# Patient Record
Sex: Female | Born: 1970 | Race: Black or African American | Hispanic: No | Marital: Married | Smoking: Former smoker
Health system: Southern US, Community
[De-identification: ages and names within clinical notes are randomized; demographics above are authoritative.]

## PROBLEM LIST (undated history)

## (undated) DIAGNOSIS — F419 Anxiety disorder, unspecified: Secondary | ICD-10-CM

## (undated) DIAGNOSIS — F329 Major depressive disorder, single episode, unspecified: Secondary | ICD-10-CM

## (undated) DIAGNOSIS — D649 Anemia, unspecified: Secondary | ICD-10-CM

## (undated) DIAGNOSIS — R011 Cardiac murmur, unspecified: Secondary | ICD-10-CM

## (undated) DIAGNOSIS — I10 Essential (primary) hypertension: Secondary | ICD-10-CM

## (undated) DIAGNOSIS — F32A Depression, unspecified: Secondary | ICD-10-CM

## (undated) HISTORY — DX: Essential (primary) hypertension: I10

## (undated) HISTORY — DX: Depression, unspecified: F32.A

## (undated) HISTORY — DX: Anxiety disorder, unspecified: F41.9

## (undated) HISTORY — DX: Anemia, unspecified: D64.9

## (undated) HISTORY — DX: Cardiac murmur, unspecified: R01.1

---
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
## 1999-09-26 ENCOUNTER — Inpatient Hospital Stay (HOSPITAL_COMMUNITY): Admission: AD | Admit: 1999-09-26 | Discharge: 1999-09-29 | Payer: Self-pay | Admitting: Gynecology

## 1999-12-09 ENCOUNTER — Other Ambulatory Visit: Admission: RE | Admit: 1999-12-09 | Discharge: 1999-12-09 | Payer: Self-pay | Admitting: Gynecology

## 2000-12-28 ENCOUNTER — Other Ambulatory Visit: Admission: RE | Admit: 2000-12-28 | Discharge: 2000-12-28 | Payer: Self-pay | Admitting: Gynecology

## 2003-02-17 ENCOUNTER — Other Ambulatory Visit: Admission: RE | Admit: 2003-02-17 | Discharge: 2003-02-17 | Payer: Self-pay | Admitting: Internal Medicine

## 2004-10-25 ENCOUNTER — Emergency Department (HOSPITAL_COMMUNITY): Admission: EM | Admit: 2004-10-25 | Discharge: 2004-10-25 | Payer: Self-pay | Admitting: Family Medicine

## 2005-04-12 ENCOUNTER — Other Ambulatory Visit: Admission: RE | Admit: 2005-04-12 | Discharge: 2005-04-12 | Payer: Self-pay | Admitting: Obstetrics and Gynecology

## 2005-10-01 ENCOUNTER — Inpatient Hospital Stay (HOSPITAL_COMMUNITY): Admission: AD | Admit: 2005-10-01 | Discharge: 2005-10-03 | Payer: Self-pay | Admitting: Obstetrics and Gynecology

## 2005-10-01 ENCOUNTER — Encounter (INDEPENDENT_AMBULATORY_CARE_PROVIDER_SITE_OTHER): Payer: Self-pay | Admitting: Specialist

## 2005-11-08 ENCOUNTER — Other Ambulatory Visit: Admission: RE | Admit: 2005-11-08 | Discharge: 2005-11-08 | Payer: Self-pay | Admitting: Obstetrics and Gynecology

## 2006-07-25 ENCOUNTER — Emergency Department (HOSPITAL_COMMUNITY): Admission: EM | Admit: 2006-07-25 | Discharge: 2006-07-25 | Payer: Self-pay | Admitting: Emergency Medicine

## 2008-06-27 ENCOUNTER — Inpatient Hospital Stay (HOSPITAL_COMMUNITY): Admission: AD | Admit: 2008-06-27 | Discharge: 2008-06-28 | Payer: Self-pay | Admitting: Obstetrics and Gynecology

## 2008-07-01 ENCOUNTER — Inpatient Hospital Stay (HOSPITAL_COMMUNITY): Admission: RE | Admit: 2008-07-01 | Discharge: 2008-07-03 | Payer: Self-pay | Admitting: Obstetrics and Gynecology

## 2010-12-10 NOTE — Discharge Summary (Signed)
[facility]  Patient:    Alexis Robertson, Alexis Robertson                    MRN: [id number] Adm. Date:  [id number] Disc. Date: [id number] Attending:  Tonye Royalty Dictator:   Antony Contras, RNC, [facility]                           Discharge Summary  DISCHARGE DIAGNOSES:          1. Intrauterine pregnancy at term.                               2. Normal spontaneous vaginal delivery of a viable                                  female infant.  HISTORY OF PRESENT ILLNESS:   The patient is a 40 year old, gravida 2, para 1-0-0-1, with an EDC of September 30, 1999.  Previous pregnancy was complicated by preterm labor, but she did deliver at term.  She also had positive GBS culture ith her previous pregnancy.  PRENATAL LABORATORY DATA:     Blood type O positive, antibody screen negative, sickle cell trait negative, rubella titer positive.  HBSAG, HIV, negative. MSAFP within normal limits.  HOSPITAL COURSE:              The patient was admitted to [facility] [place] at 39-1/7 weeks with contractions two to five minutes apart. Reasuring fetal heart rate tracing.  Cervix at this point was 3 to 4 cm, completely effaced, with a +1 station.  Clear amniotic fluid plus positive nitrazine.  She was admitted to labor and delivery and started on penicillin-G per protocol.  She progressed to complete dilatation and delivered an Apgars 8 and 9 female infant weighing 7 pounds. Cord blood pH was 7.29.  Placenta intact with three vessel cord. Midline episiotomy.  Postpartum course was complicated by some mild dizziness which did  respond to increased fluids.  Her postpartum CBC revealed a mild anemia. Hematocrit 29.6, hemoglobin 9.7, platelets 164, WBC 4.4.  She was able to be discharged on her second postpartum day.  She remained afebrile throughout her course and was able to go home with her baby in satisfactory condition.  DISPOSITION:                   Follow up in the office in four to six weeks. Continue with prenatal vitamins and iron.  Motrin 600 mg p.o. p.r.n. q.6h. for pain. DD:  10/15/99 TD:  10/15/99 Job: [id number]

## 2011-04-29 LAB — CBC
HCT: 29.4 % — ABNORMAL LOW (ref 36.0–46.0)
HCT: 32.9 % — ABNORMAL LOW (ref 36.0–46.0)
Hemoglobin: 10.2 g/dL — ABNORMAL LOW (ref 12.0–15.0)
MCHC: 34.5 g/dL (ref 30.0–36.0)
Platelets: 140 10*3/uL — ABNORMAL LOW (ref 150–400)
Platelets: 192 10*3/uL (ref 150–400)
RBC: 3.82 MIL/uL — ABNORMAL LOW (ref 3.87–5.11)
RDW: 14.3 % (ref 11.5–15.5)

## 2011-04-29 LAB — RPR: RPR Ser Ql: NONREACTIVE

## 2011-06-02 ENCOUNTER — Encounter: Payer: Self-pay | Admitting: Emergency Medicine

## 2011-06-02 ENCOUNTER — Emergency Department (HOSPITAL_COMMUNITY)
Admission: EM | Admit: 2011-06-02 | Discharge: 2011-06-02 | Disposition: A | Discharge status: Home / Self Care | Payer: BC Managed Care – PPO | Source: Home / Self Care | Attending: Family Medicine | Admitting: Family Medicine

## 2011-06-02 DIAGNOSIS — R21 Rash and other nonspecific skin eruption: Secondary | ICD-10-CM

## 2011-06-02 MED ORDER — PERMETHRIN 5 % EX CREA: TOPICAL_CREAM | CUTANEOUS | Status: AC

## 2011-06-02 MED ORDER — HYDROXYZINE HCL 25 MG PO TABS: 25.0000 mg | ORAL_TABLET | Freq: Four times a day (QID) | ORAL | Status: AC

## 2011-06-02 NOTE — ED Notes (Signed)
C/o itching, generalized itching.  Hands not itching.

## 2011-06-02 NOTE — ED Provider Notes (Signed)
History     CSN: [id number] Arrival date & time: 06/02/2011 10:41 AM   First MD Initiated Contact with Patient 06/02/11 1103      Chief Complaint  Patient presents with  . Pruritis    (Consider location/radiation/quality/duration/timing/severity/associated sxs/prior treatment) Patient is a 40 y.o. female presenting with rash.  Rash  This is a new problem. The current episode started 2 days ago. The problem has not changed since onset.The rash is present on the right arm, abdomen and left arm. She has tried nothing for the symptoms.  awakens her from sleep due to itching. She is concerned she may have scabies. No other contact with symtoms  History reviewed. No pertinent past medical history.  History reviewed. No pertinent past surgical history.  History reviewed. No pertinent family history.  History  Substance Use Topics  . Smoking status: Current Everyday Smoker  . Smokeless tobacco: Not on file  . Alcohol Use: Yes    OB History    Grav Para Term Preterm Abortions TAB SAB Ect Mult Living                  Review of Systems  Constitutional: Negative.   HENT: Negative.   Respiratory: Negative.   Cardiovascular: Negative.   Gastrointestinal: Negative.   Genitourinary: Negative.   Skin: Positive for rash.    Allergies  Review of patient's allergies indicates no known allergies.  Home Medications  No current outpatient prescriptions on file.  BP 133/92  Pulse 101  Temp(Src) 97.9 F (36.6 C) (Oral)  Resp 16  SpO2 99%  LMP 05/19/2011  Physical Exam  Vitals reviewed. Constitutional: She appears well-developed.  Cardiovascular: Normal rate and regular rhythm.   Pulmonary/Chest: Breath sounds normal.  Skin: Skin is warm. There is erythema.       Few red raised areas on abd and forearms.     ED Course  Procedures (including critical care time)  Labs Reviewed - No data to display No results found.   No diagnosis found.    MDM           Randa Spike, MD 06/02/11 1230

## 2013-04-09 ENCOUNTER — Encounter (HOSPITAL_COMMUNITY): Payer: Self-pay | Admitting: Emergency Medicine

## 2013-04-09 ENCOUNTER — Emergency Department (HOSPITAL_COMMUNITY)
Admission: EM | Admit: 2013-04-09 | Discharge: 2013-04-10 | Disposition: A | Discharge status: Home / Self Care | Payer: Managed Care, Other (non HMO) | Attending: Emergency Medicine | Admitting: Emergency Medicine

## 2013-04-09 ENCOUNTER — Emergency Department (HOSPITAL_COMMUNITY): Payer: Managed Care, Other (non HMO)

## 2013-04-09 ENCOUNTER — Other Ambulatory Visit: Payer: Self-pay

## 2013-04-09 DIAGNOSIS — R0789 Other chest pain: Secondary | ICD-10-CM

## 2013-04-09 DIAGNOSIS — F17200 Nicotine dependence, unspecified, uncomplicated: Secondary | ICD-10-CM | POA: Insufficient documentation

## 2013-04-09 DIAGNOSIS — R071 Chest pain on breathing: Secondary | ICD-10-CM | POA: Insufficient documentation

## 2013-04-09 LAB — CBC
HCT: 37.1 % (ref 36.0–46.0)
MCV: 82.3 fL (ref 78.0–100.0)
RDW: 13.7 % (ref 11.5–15.5)
WBC: 4.8 10*3/uL (ref 4.0–10.5)

## 2013-04-09 LAB — BASIC METABOLIC PANEL
BUN: 15 mg/dL (ref 6–23)
CO2: 21 mEq/L (ref 19–32)
Calcium: 9.5 mg/dL (ref 8.4–10.5)
Creatinine, Ser: 0.64 mg/dL (ref 0.50–1.10)
GFR calc Af Amer: >90 mL/min (ref >90)

## 2013-04-09 NOTE — ED Notes (Signed)
Pt stated burning R chest pain that began 45 minutes ago with ShOB and nausea. Denies previous cardiac hx.

## 2013-04-09 NOTE — ED Provider Notes (Signed)
CSN: [id number]     Arrival date & time 04/09/13  2228 History   First MD Initiated Contact with Patient 04/09/13 2326     Chief Complaint  Patient presents with  . Chest Pain   (Consider location/radiation/quality/duration/timing/severity/associated sxs/prior Treatment) Patient is a 42 y.o. female presenting with chest pain. The history is provided by the patient. No language interpreter was used.  Chest Pain Pain location:  R chest Pain quality: burning   Pain radiates to:  Does not radiate Pain radiates to the back: no   Pain severity:  Moderate Onset quality:  Sudden Progression:  Partially resolved Chronicity:  New Context: breathing   Worsened by:  Deep breathing   History reviewed. No pertinent past medical history. History reviewed. No pertinent past surgical history. History reviewed. No pertinent family history. History  Substance Use Topics  . Smoking status: Current Every Day Smoker  . Smokeless tobacco: Not on file  . Alcohol Use: Yes     Comment: socially   OB History   Grav Para Term Preterm Abortions TAB SAB Ect Mult Living                 Review of Systems  Cardiovascular: Positive for chest pain.  All other systems reviewed and are negative.    Allergies  Review of patient's allergies indicates no known allergies.  Home Medications   Current Outpatient Rx  Name  Route  Sig  Dispense  Refill  . nicotine polacrilex (NICORETTE) 4 MG gum   Oral   Take 4 mg by mouth as needed for smoking cessation.          BP 117/69  Pulse 86  Temp(Src) 98.4 F (36.9 C) (Oral)  Resp 16  Ht 5\' 3"  (1.6 m)  Wt 170 lb (77.111 kg)  BMI 30.12 kg/m2  SpO2 99%  LMP 03/19/2013 Physical Exam  Nursing note and vitals reviewed. Constitutional: She is oriented to person, place, and time. She appears well-developed and well-nourished.  HENT:  Head: Normocephalic.  Eyes: Pupils are equal, round, and reactive to light.  Neck: Normal range of motion.   Cardiovascular: Normal rate and intact distal pulses.   Pulmonary/Chest: Effort normal and breath sounds normal.  Abdominal: Soft. Bowel sounds are normal.  Musculoskeletal: She exhibits no edema and no tenderness.  Lymphadenopathy:    She has no cervical adenopathy.  Neurological: She is alert and oriented to person, place, and time.  Skin: Skin is warm and dry.  Psychiatric: She has a normal mood and affect. Her behavior is normal. Judgment and thought content normal.    ED Course  Procedures (including critical care time)   Date: 04/09/2013  Rate: 82   Rhythm: Normal sinus with PJC's  QRS Axis: normal  Intervals: normal  ST/T Wave abnormalities: normal  Conduction Disutrbances:none  Narrative Interpretation: NSR with PJC's  Old EKG Reviewed: none available    Labs Review Labs Reviewed  BASIC METABOLIC PANEL - Abnormal; Notable for the following:    Potassium 3.4 (*)    Glucose, Bld 119 (*)    All other components within normal limits  CBC  D-DIMER, QUANTITATIVE  POCT I-STAT TROPONIN I   Imaging Review Dg Chest 2 View  04/09/2013   CLINICAL DATA:  Chest pain.  EXAM: CHEST  2 VIEW  COMPARISON:  No priors.  FINDINGS: Lung volumes are normal. No consolidative airspace disease. No pleural effusions. No pneumothorax. No pulmonary nodule or mass noted. Pulmonary vasculature and the cardiomediastinal silhouette  are within normal limits.  IMPRESSION: 1.  No radiographic evidence of acute cardiopulmonary disease.   Electronically Signed   By: Trudie Reed M.D.   On: 04/09/2013 23:35   Lab and radiology results reviewed, shared with patient, and are reassuring.  Pleuritic right sided chest pain, now resolved.  Patient to follow-up with her PCP.  MDM  Right sided chest pain.    Jimmye Norman, NP 04/10/13 0120

## 2013-04-10 LAB — D-DIMER, QUANTITATIVE: D-Dimer, Quant: <0.27 ug/mL-FEU (ref 0.00–0.48)

## 2013-04-10 NOTE — ED Provider Notes (Signed)
Medical screening examination/treatment/procedure(s) were performed by non-physician practitioner and as supervising physician I was immediately available for consultation/collaboration.   Dagmar Hait, MD 04/10/13 [phone]

## 2013-05-30 ENCOUNTER — Other Ambulatory Visit: Payer: Self-pay

## 2017-04-14 ENCOUNTER — Encounter: Payer: Self-pay | Admitting: Internal Medicine

## 2018-05-10 ENCOUNTER — Other Ambulatory Visit: Payer: Self-pay | Admitting: Family Medicine

## 2018-05-10 DIAGNOSIS — Z1231 Encounter for screening mammogram for malignant neoplasm of breast: Secondary | ICD-10-CM

## 2018-08-06 ENCOUNTER — Ambulatory Visit
Admission: RE | Admit: 2018-08-06 | Discharge: 2018-08-06 | Disposition: A | Discharge status: Home / Self Care | Payer: Managed Care, Other (non HMO) | Source: Ambulatory Visit | Attending: Family Medicine | Admitting: Family Medicine

## 2018-08-06 DIAGNOSIS — Z1231 Encounter for screening mammogram for malignant neoplasm of breast: Secondary | ICD-10-CM

## 2018-08-08 ENCOUNTER — Other Ambulatory Visit: Payer: Self-pay | Admitting: Family Medicine

## 2018-08-08 DIAGNOSIS — R928 Other abnormal and inconclusive findings on diagnostic imaging of breast: Secondary | ICD-10-CM

## 2018-08-10 ENCOUNTER — Ambulatory Visit
Admission: RE | Admit: 2018-08-10 | Discharge: 2018-08-10 | Disposition: A | Discharge status: Home / Self Care | Payer: Managed Care, Other (non HMO) | Source: Ambulatory Visit | Attending: Family Medicine | Admitting: Family Medicine

## 2018-08-10 ENCOUNTER — Other Ambulatory Visit: Payer: Self-pay | Admitting: Family Medicine

## 2018-08-10 DIAGNOSIS — R928 Other abnormal and inconclusive findings on diagnostic imaging of breast: Secondary | ICD-10-CM

## 2019-07-10 ENCOUNTER — Other Ambulatory Visit: Payer: Self-pay | Admitting: Family Medicine

## 2019-07-10 DIAGNOSIS — Z1231 Encounter for screening mammogram for malignant neoplasm of breast: Secondary | ICD-10-CM

## 2019-08-12 ENCOUNTER — Encounter: Payer: Self-pay | Admitting: Gastroenterology

## 2019-08-27 ENCOUNTER — Other Ambulatory Visit: Payer: Self-pay

## 2019-08-27 ENCOUNTER — Ambulatory Visit
Admission: RE | Admit: 2019-08-27 | Discharge: 2019-08-27 | Disposition: A | Discharge status: Home / Self Care | Payer: PRIVATE HEALTH INSURANCE | Source: Ambulatory Visit | Attending: Family Medicine | Admitting: Family Medicine

## 2019-08-27 DIAGNOSIS — Z1231 Encounter for screening mammogram for malignant neoplasm of breast: Secondary | ICD-10-CM

## 2019-09-06 ENCOUNTER — Other Ambulatory Visit: Payer: Self-pay

## 2019-09-06 ENCOUNTER — Ambulatory Visit (AMBULATORY_SURGERY_CENTER): Payer: Self-pay | Admitting: *Deleted

## 2019-09-06 VITALS — Temp 96.8°F | Ht 63.0 in | Wt 177.0 lb

## 2019-09-06 DIAGNOSIS — Z1211 Encounter for screening for malignant neoplasm of colon: Secondary | ICD-10-CM

## 2019-09-06 MED ORDER — SUPREP BOWEL PREP KIT 17.5-3.13-1.6 GM/177ML PO SOLN: ORAL | 0 refills | Status: DC

## 2019-09-06 NOTE — Progress Notes (Signed)
Pt had second covid vaccine 08-22-19- no need for pre procedure covid test  Pt is aware that care partner will wait in the car during procedure; if they feel like they will be too hot or cold to wait in the car; they may wait in the 4 th floor lobby. Patient is aware to bring only one care partner. We want them to wear a mask (we do not have any that we can provide them), practice social distancing, and we will check their temperatures when they get here.  I did remind the patient that their care partner needs to stay in the parking lot the entire time and have a cell phone available, we will call them when the pt is ready for discharge. Patient will wear mask into building.  No egg or soy allergy  No home oxygen use   No trouble moving neck  No medications for weight loss taken  emmi information given  $15 off Suprep coupon given

## 2019-09-19 ENCOUNTER — Encounter: Payer: Self-pay | Admitting: Gastroenterology

## 2019-09-20 ENCOUNTER — Encounter: Payer: Self-pay | Admitting: Gastroenterology

## 2019-09-20 ENCOUNTER — Other Ambulatory Visit: Payer: Self-pay

## 2019-09-20 ENCOUNTER — Ambulatory Visit (AMBULATORY_SURGERY_CENTER): Payer: 59 | Admitting: Gastroenterology

## 2019-09-20 VITALS — BP 129/67 | HR 62 | Temp 96.6°F | Resp 16 | Ht 63.0 in | Wt 177.0 lb

## 2019-09-20 DIAGNOSIS — Z1211 Encounter for screening for malignant neoplasm of colon: Secondary | ICD-10-CM | POA: Diagnosis not present

## 2019-09-20 MED ORDER — SODIUM CHLORIDE 0.9 % IV SOLN: 500.0000 mL | Freq: Once | INTRAVENOUS | Status: DC

## 2019-09-20 NOTE — Op Note (Signed)
[facility] Patient Name: Alexis Robertson Procedure Date: 09/20/2019 12:07 PM MRN: [id number] Endoscopist: Viviann Spare P. Adela Lank , MD Age: 49 Referring MD:  Date of Birth: [birth year] Gender: Female Account #: [id number] Procedure:                Colonoscopy Indications:              Screening for colorectal malignant neoplasm, This                            is the patient's first colonoscopy Medicines:                Monitored Anesthesia Care Procedure:                Pre-Anesthesia Assessment:                           - Prior to the procedure, a History and Physical                            was performed, and patient medications and                            allergies were reviewed. The patient's tolerance of                            previous anesthesia was also reviewed. The risks                            and benefits of the procedure and the sedation                            options and risks were discussed with the patient.                            All questions were answered, and informed consent                            was obtained. Prior Anticoagulants: The patient has                            taken no previous anticoagulant or antiplatelet                            agents. ASA Grade Assessment: II - A patient with                            mild systemic disease. After reviewing the risks                            and benefits, the patient was deemed in                            satisfactory condition to undergo the procedure.  After obtaining informed consent, the colonoscope                            was passed under direct vision. Throughout the                            procedure, the patient's blood pressure, pulse, and                            oxygen saturations were monitored continuously. The                            Colonoscope was introduced through the anus and                            advanced to the  the cecum, identified by                            appendiceal orifice and ileocecal valve. The                            colonoscopy was performed without difficulty. The                            patient tolerated the procedure well. The quality                            of the bowel preparation was good. The ileocecal                            valve, appendiceal orifice, and rectum were                            photographed. Scope In: 12:12:17 PM Scope Out: 12:32:01 PM Scope Withdrawal Time: 0 hours 14 minutes 16 seconds  Total Procedure Duration: 0 hours 19 minutes 44 seconds  Findings:                 The perianal and digital rectal examinations were                            normal.                           The colon was tortuous with looping.                           There was a medium-sized lipoma, in the transverse                            colon.                           Internal hemorrhoids were found during  retroflexion. The hemorrhoids were small.                           Anal papilla(e) were hypertrophied.                           The exam was otherwise without abnormality. Complications:            No immediate complications. Estimated blood loss:                            None. Estimated Blood Loss:     Estimated blood loss: none. Impression:               - Tortuous colon.                           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                           - Internal hemorrhoids.                           - Anal papilla(e) were hypertrophied.                           - The examination was otherwise normal.                           - No polyps. Recommendation:           - Patient has a contact number available for                            emergencies. The signs and symptoms of potential                            delayed complications were discussed with the                            patient. Return to normal  activities tomorrow.                            Written discharge instructions were provided to the                            patient.                           - Resume previous diet.                           - Continue present medications.                           - Repeat colonoscopy in 10 years for screening                            purposes.  Viviann Spare P. [name], MD 09/20/2019 12:35:39 PM This report has been signed electronically.

## 2019-09-20 NOTE — Patient Instructions (Signed)
You may resume your previous diet and medication schedule.  Thank you for allowing us to care for you today!!!  YOU HAD AN ENDOSCOPIC PROCEDURE TODAY AT THE [facility]:   Refer to the procedure report that was given to you for any specific questions about what was found during the examination.  If the procedure report does not answer your questions, please call your gastroenterologist to clarify.  If you requested that your care partner not be given the details of your procedure findings, then the procedure report has been included in a sealed envelope for you to review at your convenience later.  YOU SHOULD EXPECT: Some feelings of bloating in the abdomen. Passage of more gas than usual.  Walking can help get rid of the air that was put into your GI tract during the procedure and reduce the bloating. If you had a lower endoscopy (such as a colonoscopy or flexible sigmoidoscopy) you may notice spotting of blood in your stool or on the toilet paper. If you underwent a bowel prep for your procedure, you may not have a normal bowel movement for a few days.  Please Note:  You might notice some irritation and congestion in your nose or some drainage.  This is from the oxygen used during your procedure.  There is no need for concern and it should clear up in a day or so.  SYMPTOMS TO REPORT IMMEDIATELY:   Following lower endoscopy (colonoscopy or flexible sigmoidoscopy):  Excessive amounts of blood in the stool  Significant tenderness or worsening of abdominal pains  Swelling of the abdomen that is new, acute  Fever of 100F or higher  For urgent or emergent issues, a gastroenterologist can be reached at any hour by calling [phone].   DIET:  We do recommend a small meal at first, but then you may proceed to your regular diet.  Drink plenty of fluids but you should avoid alcoholic beverages for 24 hours.  ACTIVITY:  You should plan to take it easy for the rest of today and you  should NOT DRIVE or use heavy machinery until tomorrow (because of the sedation medicines used during the test).    FOLLOW UP: Our staff will call the number listed on your records 48-72 hours following your procedure to check on you and address any questions or concerns that you may have regarding the information given to you following your procedure. If we do not reach you, we will leave a message.  We will attempt to reach you two times.  During this call, we will ask if you have developed any symptoms of COVID 19. If you develop any symptoms (ie: fever, flu-like symptoms, shortness of breath, cough etc.) before then, please call [phone].  If you test positive for Covid 19 in the 2 weeks post procedure, please call and report this information to us.    If any biopsies were taken you will be contacted by phone or by letter within the next 1-3 weeks.  Please call us at [phone] if you have not heard about the biopsies in 3 weeks.    SIGNATURES/CONFIDENTIALITY: You and/or your care partner have signed paperwork which will be entered into your electronic medical record.  These signatures attest to the fact that that the information above on your After Visit Summary has been reviewed and is understood.  Full responsibility of the confidentiality of this discharge information lies with you and/or your care-partner.    

## 2019-09-20 NOTE — Progress Notes (Signed)
VS- Alexis Robertson Alexis Robertson  Pt's states no medical or surgical changes since previsit or office visit.

## 2019-09-20 NOTE — Progress Notes (Signed)
Pt tolerated well. VSS. Awake and to recovery. 

## 2019-09-24 ENCOUNTER — Telehealth: Payer: Self-pay | Admitting: *Deleted

## 2019-09-24 NOTE — Telephone Encounter (Signed)
  Follow up Call-  Call back number 09/20/2019  Post procedure Call Back phone  # [phone]  Permission to leave phone message Yes  Some recent data might be hidden     Patient questions:  Do you have a fever, pain , or abdominal swelling? No. Pain Score  0 *  Have you tolerated food without any problems? Yes.    Have you been able to return to your normal activities? Yes.    Do you have any questions about your discharge instructions: Diet   No. Medications  No. Follow up visit  No.  Do you have questions or concerns about your Care? No.  Actions: * If pain score is 4 or above: No action needed, pain <4  1. Have you developed a fever since your procedure? NO  2.   Have you had an respiratory symptoms (SOB or cough) since your procedure? NO  3.   Have you tested positive for COVID 19 since your procedure NO  4.   Have you had any family members/close contacts diagnosed with the COVID 19 since your procedure?  NO   If yes to any of these questions please route to Laverna Peace, RN and Jennye Boroughs, RN.

## 2020-07-15 ENCOUNTER — Other Ambulatory Visit: Payer: Self-pay | Admitting: Family Medicine

## 2020-07-15 DIAGNOSIS — Z1231 Encounter for screening mammogram for malignant neoplasm of breast: Secondary | ICD-10-CM

## 2020-08-12 ENCOUNTER — Other Ambulatory Visit: Payer: Self-pay | Admitting: Family Medicine

## 2020-08-12 DIAGNOSIS — Z1231 Encounter for screening mammogram for malignant neoplasm of breast: Secondary | ICD-10-CM

## 2020-08-31 ENCOUNTER — Ambulatory Visit
Admission: RE | Admit: 2020-08-31 | Discharge: 2020-08-31 | Disposition: A | Discharge status: Home / Self Care | Payer: 59 | Source: Ambulatory Visit

## 2020-08-31 ENCOUNTER — Other Ambulatory Visit: Payer: Self-pay

## 2020-08-31 DIAGNOSIS — Z1231 Encounter for screening mammogram for malignant neoplasm of breast: Secondary | ICD-10-CM

## 2020-12-21 IMAGING — US ULTRASOUND RIGHT BREAST LIMITED
1 series · 10 of 10 positions shown · non-contrast
Comparison: 08/06/2018

CLINICAL DATA: The patient returns for evaluation of a possible
RIGHT breast mass from baseline screening study.

EXAM:
DIGITAL DIAGNOSTIC RIGHT MAMMOGRAM WITH CAD AND TOMO
ULTRASOUND RIGHT BREAST

[Series 1: ultrasound right breast limited · 0.05mm/px · 10 of 10 slices shown]
[im 1/10]
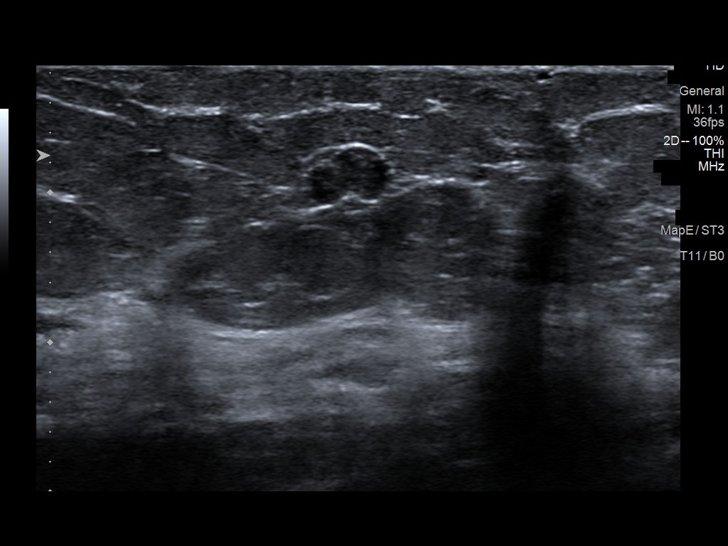
[im 2/10]
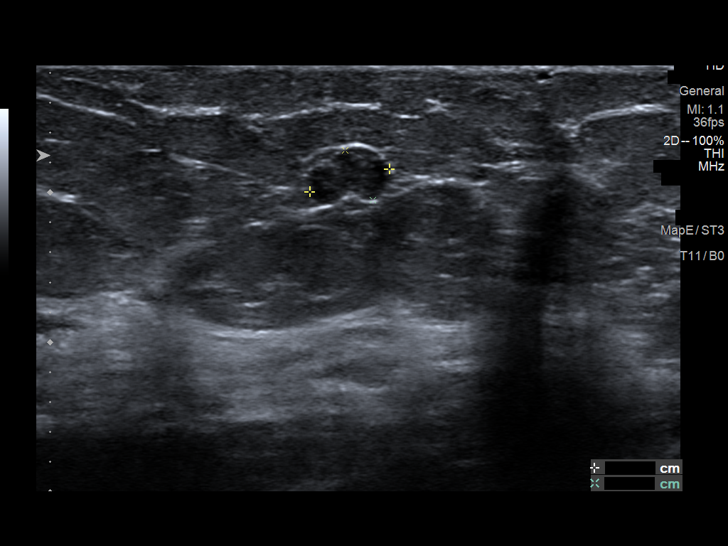
[im 3/10]
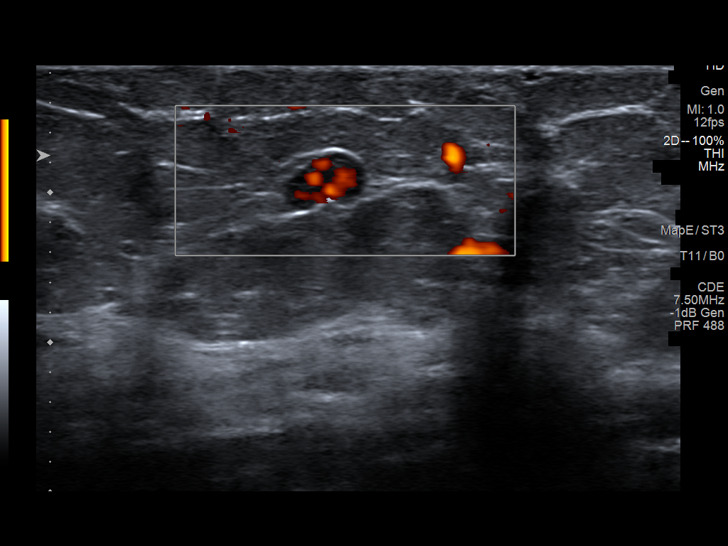
[im 4/10]
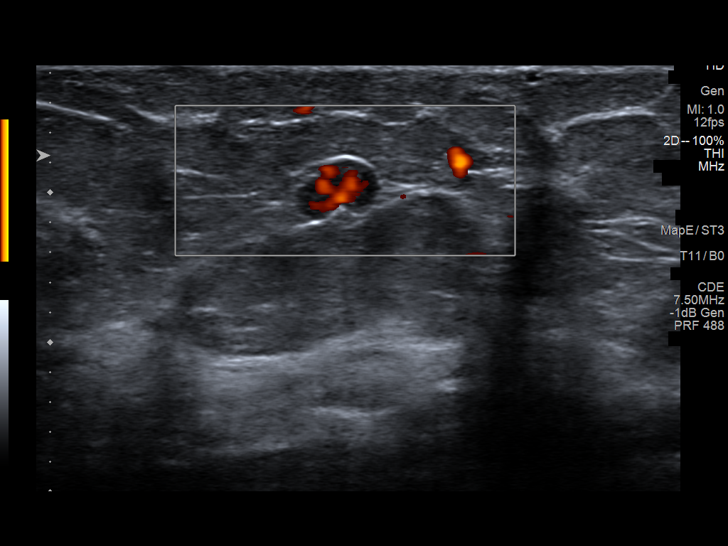
[im 5/10]
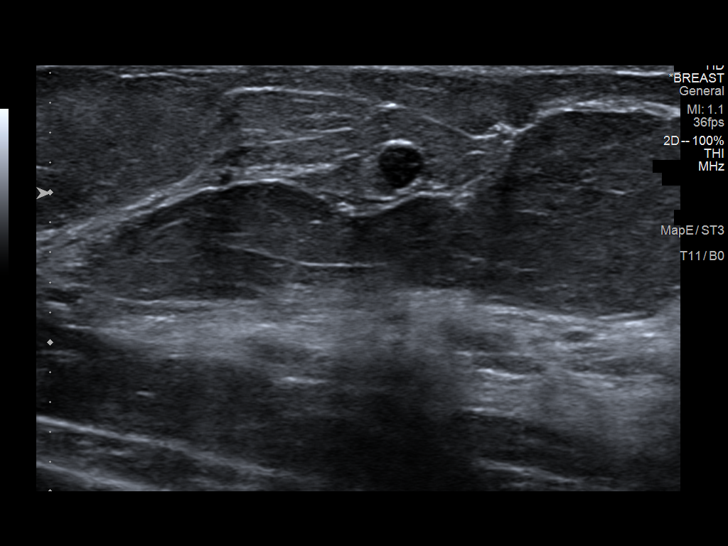
[im 6/10]
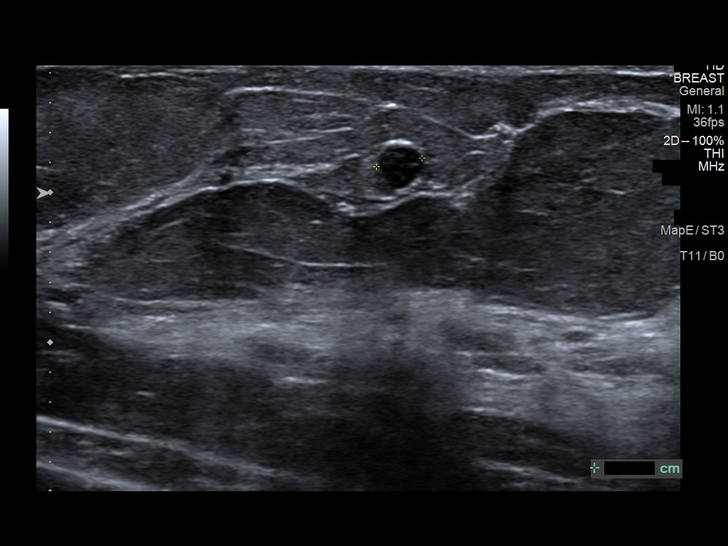
[im 7/10]
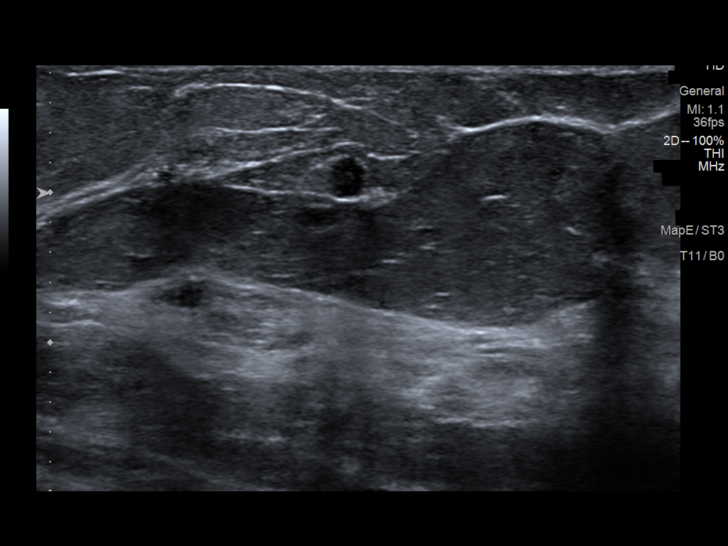
[im 8/10]
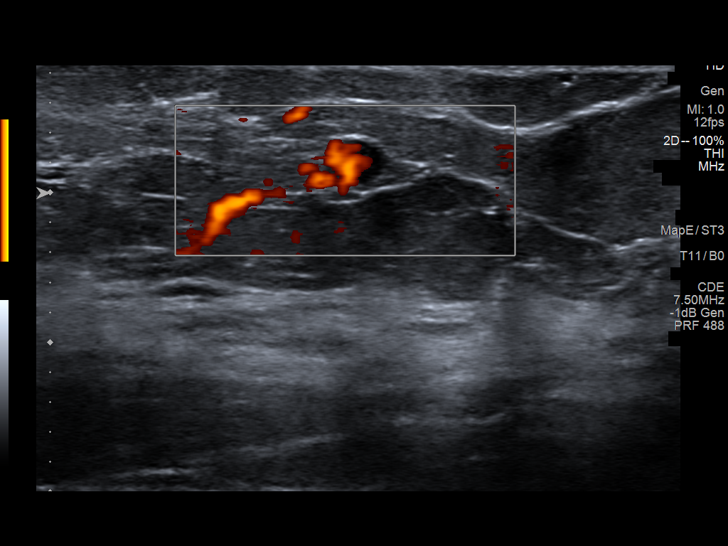
[im 9/10]
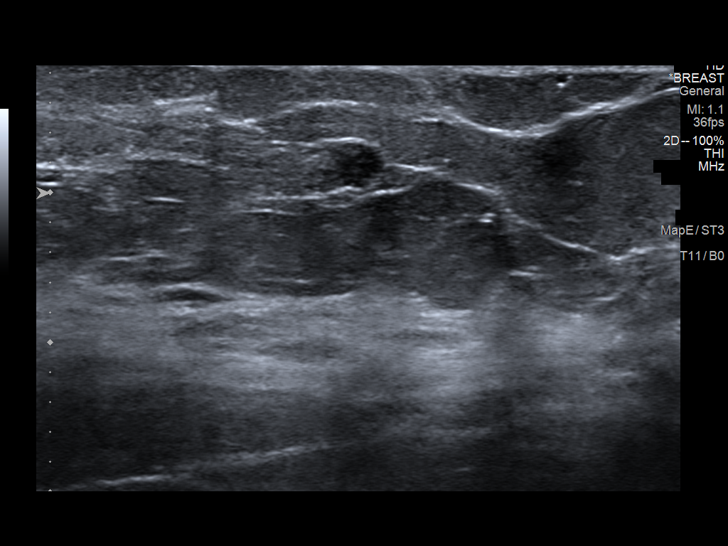
[im 10/10]
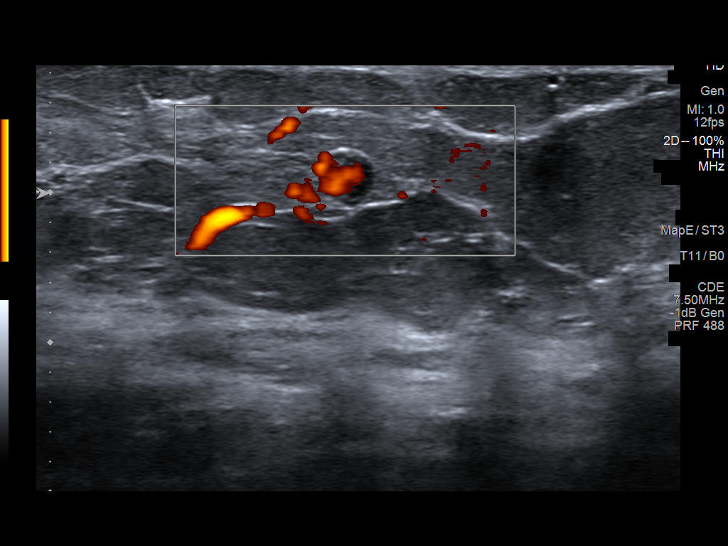

[10 of 10 positions shown; findings below may reference images not displayed]

ACR Breast Density Category c: The breast tissue is heterogeneously
dense, which may obscure small masses.
FINDINGS: Spot compression views are performed of the RIGHT breast, confirming
presence of a circumscribed oval mass in the LATERAL aspect of the
RIGHT breast. There is no associated distortion.

Mammographic images were processed with CAD.

Targeted ultrasound is performed, showing a circumscribed oval
parallel hypoechoic mass with posterior acoustic enhancement in the
8:30 o'clock location of the RIGHT breast 4 centimeters from the
nipple. There is associated fatty hilum and internal vascularity
consistent with intramammary lymph node. Lymph node measures 0.3 x
0.6 x 0.4 centimeters.
IMPRESSION: Benign lymph node in the 8:30 o'clock location of the RIGHT breast.
No mammographic or ultrasound evidence for malignancy.

RECOMMENDATION:
Screening mammogram in one year.(Code:50-E-UZO)

I have discussed the findings and recommendations with the patient.
Results were also provided in writing at the conclusion of the
visit. If applicable, a reminder letter will be sent to the patient
regarding the next appointment.

BI-RADS CATEGORY  2: Benign.

## 2020-12-21 IMAGING — MG DIGITAL DIAGNOSTIC UNILATERAL RIGHT MAMMOGRAM WITH TOMO AND CAD
4 series · 4 of 12 positions shown · non-contrast
Comparison: 08/06/2018

CLINICAL DATA: The patient returns for evaluation of a possible
RIGHT breast mass from baseline screening study.

EXAM:
DIGITAL DIAGNOSTIC RIGHT MAMMOGRAM WITH CAD AND TOMO
ULTRASOUND RIGHT BREAST

[R ML synth-2D]
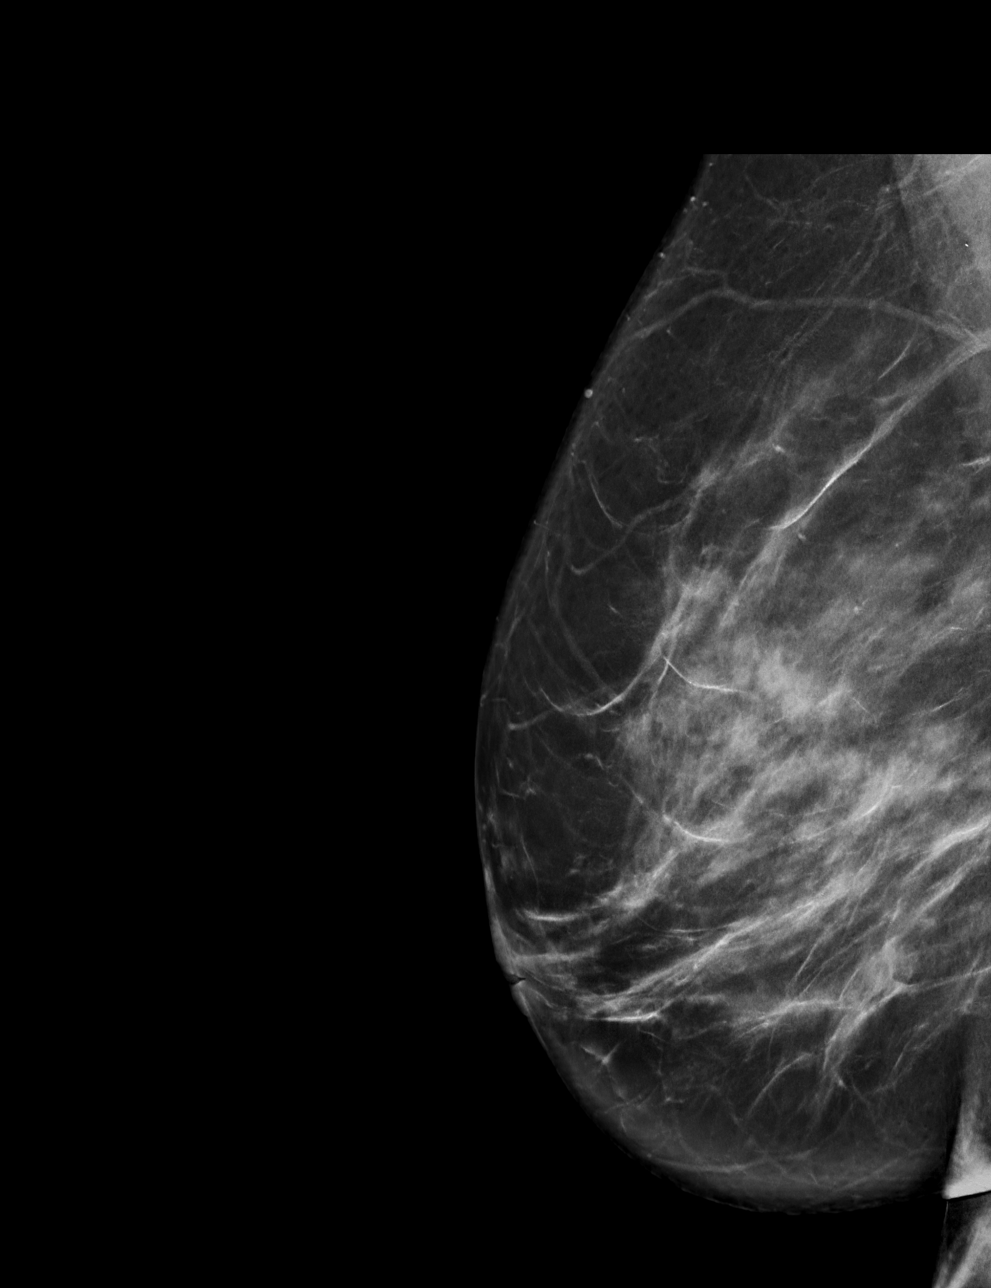

[R MLO synth-2D]
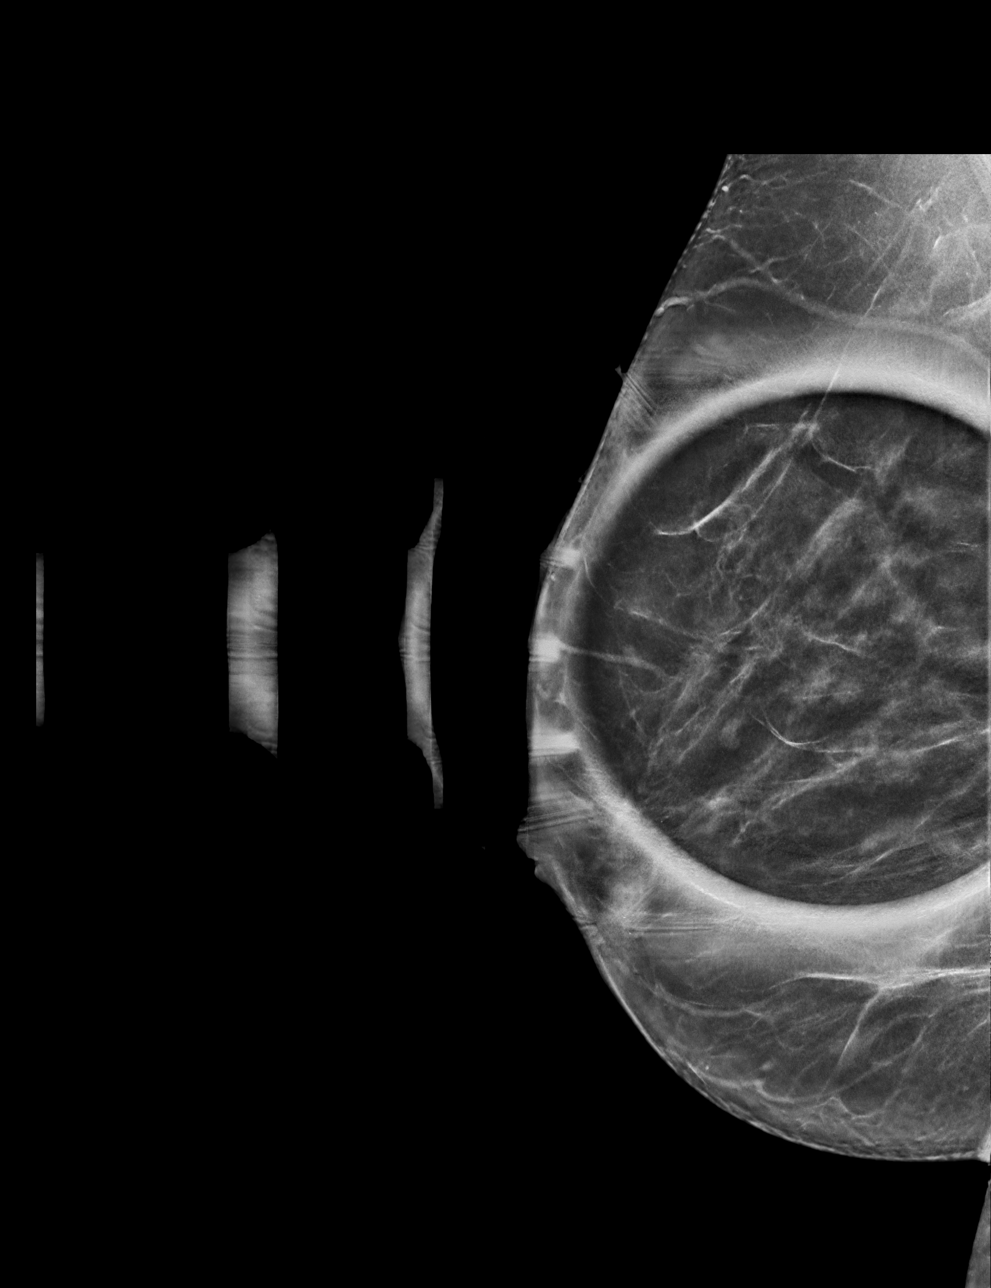

[R MLO tomo · tomo slice 35/68.0]
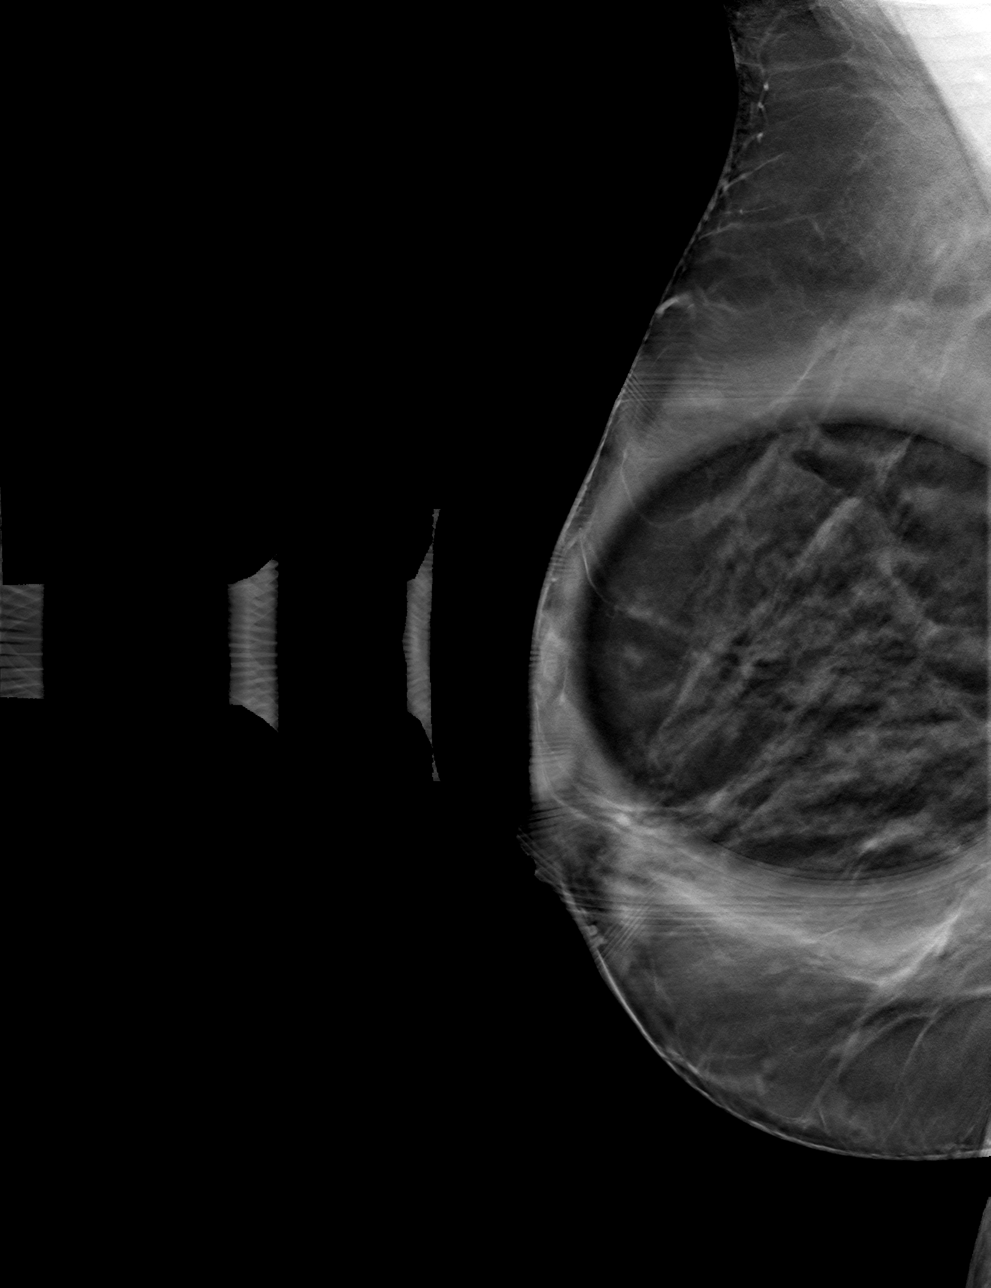

[R ML tomo · tomo slice 41/82.0]
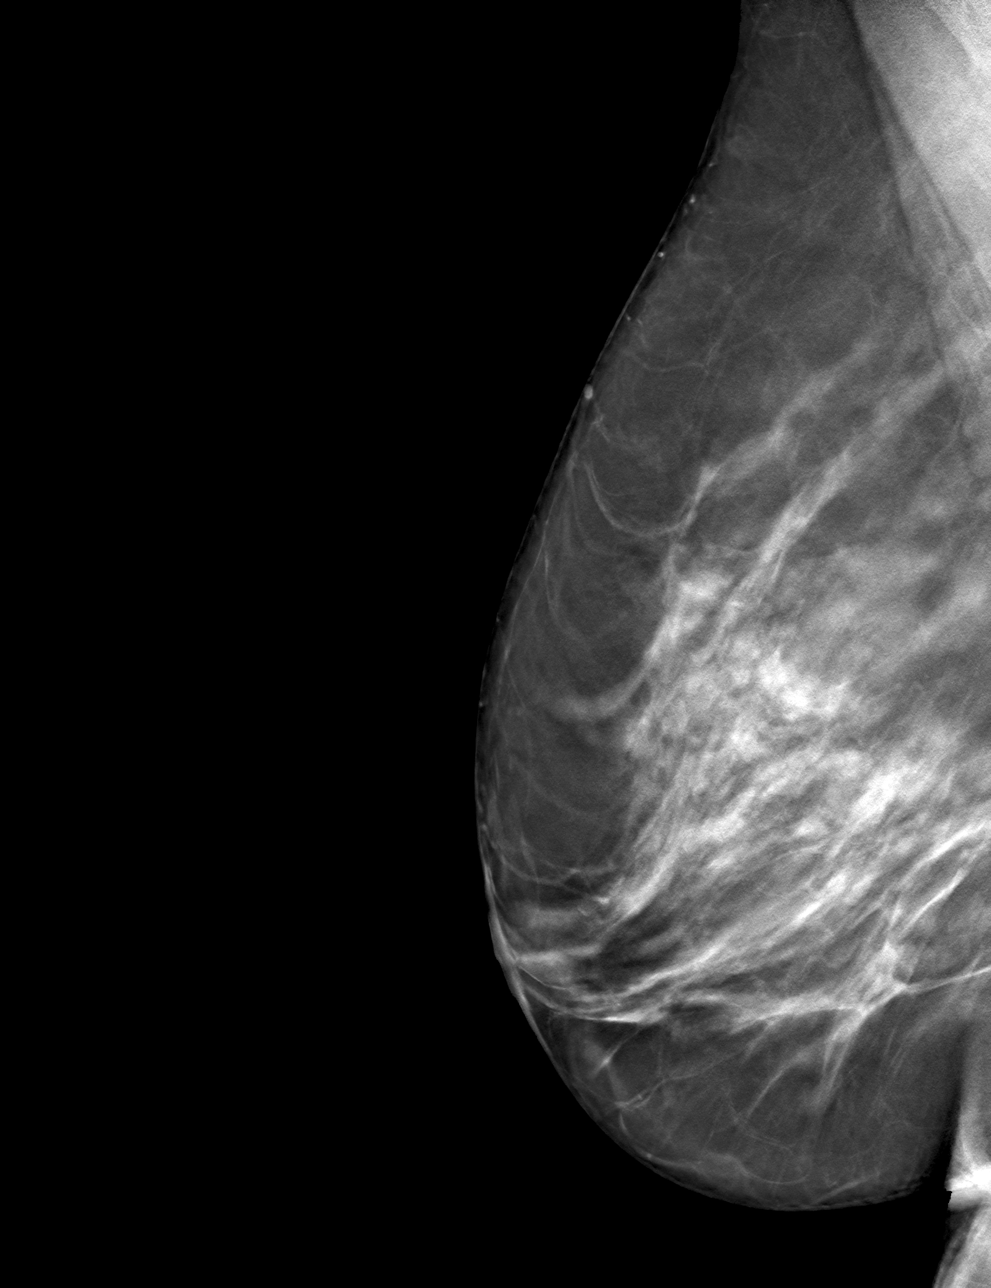

[4 of 12 positions shown; findings below may reference images not displayed]

ACR Breast Density Category c: The breast tissue is heterogeneously
dense, which may obscure small masses.
FINDINGS: Spot compression views are performed of the RIGHT breast, confirming
presence of a circumscribed oval mass in the LATERAL aspect of the
RIGHT breast. There is no associated distortion.

Mammographic images were processed with CAD.

Targeted ultrasound is performed, showing a circumscribed oval
parallel hypoechoic mass with posterior acoustic enhancement in the
8:30 o'clock location of the RIGHT breast 4 centimeters from the
nipple. There is associated fatty hilum and internal vascularity
consistent with intramammary lymph node. Lymph node measures 0.3 x
0.6 x 0.4 centimeters.
IMPRESSION: Benign lymph node in the 8:30 o'clock location of the RIGHT breast.
No mammographic or ultrasound evidence for malignancy.

RECOMMENDATION:
Screening mammogram in one year.(Code:50-E-UZO)

I have discussed the findings and recommendations with the patient.
Results were also provided in writing at the conclusion of the
visit. If applicable, a reminder letter will be sent to the patient
regarding the next appointment.

BI-RADS CATEGORY  2: Benign.

## 2022-12-30 ENCOUNTER — Other Ambulatory Visit: Payer: Self-pay | Admitting: Physician Assistant

## 2022-12-30 DIAGNOSIS — Z1231 Encounter for screening mammogram for malignant neoplasm of breast: Secondary | ICD-10-CM

## 2023-01-27 ENCOUNTER — Ambulatory Visit: Payer: 59

## 2023-01-27 ENCOUNTER — Ambulatory Visit
Admission: RE | Admit: 2023-01-27 | Discharge: 2023-01-27 | Disposition: A | Discharge status: Home / Self Care | Payer: 59 | Source: Ambulatory Visit | Attending: Physician Assistant | Admitting: Physician Assistant

## 2023-01-27 DIAGNOSIS — Z1231 Encounter for screening mammogram for malignant neoplasm of breast: Secondary | ICD-10-CM

## 2023-01-31 ENCOUNTER — Other Ambulatory Visit: Payer: Self-pay | Admitting: Physician Assistant

## 2023-01-31 DIAGNOSIS — R928 Other abnormal and inconclusive findings on diagnostic imaging of breast: Secondary | ICD-10-CM

## 2023-02-02 ENCOUNTER — Ambulatory Visit
Admission: RE | Admit: 2023-02-02 | Discharge: 2023-02-02 | Disposition: A | Discharge status: Home / Self Care | Payer: 59 | Source: Ambulatory Visit | Attending: Physician Assistant | Admitting: Physician Assistant

## 2023-02-02 DIAGNOSIS — R928 Other abnormal and inconclusive findings on diagnostic imaging of breast: Secondary | ICD-10-CM

## 2023-03-24 ENCOUNTER — Other Ambulatory Visit: Payer: Self-pay | Admitting: Family Medicine

## 2023-03-24 DIAGNOSIS — R1084 Generalized abdominal pain: Secondary | ICD-10-CM

## 2023-03-29 ENCOUNTER — Ambulatory Visit
Admission: RE | Admit: 2023-03-29 | Discharge: 2023-03-29 | Disposition: A | Discharge status: Home / Self Care | Payer: 59 | Source: Ambulatory Visit | Attending: Family Medicine | Admitting: Family Medicine

## 2023-03-29 DIAGNOSIS — R1084 Generalized abdominal pain: Secondary | ICD-10-CM

## 2024-01-10 ENCOUNTER — Other Ambulatory Visit: Payer: Self-pay | Admitting: Family Medicine

## 2024-01-10 DIAGNOSIS — Z1231 Encounter for screening mammogram for malignant neoplasm of breast: Secondary | ICD-10-CM

## 2024-02-09 ENCOUNTER — Ambulatory Visit
Admission: RE | Admit: 2024-02-09 | Discharge: 2024-02-09 | Disposition: A | Discharge status: Home / Self Care | Source: Ambulatory Visit | Attending: Family Medicine | Admitting: Family Medicine

## 2024-02-09 DIAGNOSIS — Z1231 Encounter for screening mammogram for malignant neoplasm of breast: Secondary | ICD-10-CM
